# Patient Record
Sex: Male | Born: 2000 | Race: White | Hispanic: No | Marital: Single
Health system: Southern US, Community
[De-identification: ages and names within clinical notes are randomized; demographics above are authoritative.]

---

## 2019-04-15 ENCOUNTER — Encounter (HOSPITAL_COMMUNITY): Payer: Self-pay | Admitting: Emergency Medicine

## 2019-04-15 ENCOUNTER — Emergency Department (HOSPITAL_COMMUNITY)
Admission: EM | Admit: 2019-04-15 | Discharge: 2019-04-15 | Disposition: A | Discharge status: Home / Self Care | Payer: 59 | Attending: Emergency Medicine | Admitting: Emergency Medicine

## 2019-04-15 ENCOUNTER — Other Ambulatory Visit: Payer: Self-pay

## 2019-04-15 ENCOUNTER — Emergency Department (HOSPITAL_COMMUNITY): Payer: 59

## 2019-04-15 DIAGNOSIS — Z23 Encounter for immunization: Secondary | ICD-10-CM | POA: Insufficient documentation

## 2019-04-15 DIAGNOSIS — W231XXA Caught, crushed, jammed, or pinched between stationary objects, initial encounter: Secondary | ICD-10-CM | POA: Insufficient documentation

## 2019-04-15 DIAGNOSIS — Y939 Activity, unspecified: Secondary | ICD-10-CM | POA: Insufficient documentation

## 2019-04-15 DIAGNOSIS — Y999 Unspecified external cause status: Secondary | ICD-10-CM | POA: Diagnosis not present

## 2019-04-15 DIAGNOSIS — Y929 Unspecified place or not applicable: Secondary | ICD-10-CM | POA: Insufficient documentation

## 2019-04-15 DIAGNOSIS — S61311A Laceration without foreign body of left index finger with damage to nail, initial encounter: Secondary | ICD-10-CM | POA: Diagnosis not present

## 2019-04-15 DIAGNOSIS — S62661A Nondisplaced fracture of distal phalanx of left index finger, initial encounter for closed fracture: Secondary | ICD-10-CM | POA: Diagnosis not present

## 2019-04-15 DIAGNOSIS — S61211A Laceration without foreign body of left index finger without damage to nail, initial encounter: Secondary | ICD-10-CM

## 2019-04-15 MED ORDER — CEPHALEXIN 500 MG PO CAPS: 500.0000 mg | ORAL_CAPSULE | Freq: Two times a day (BID) | ORAL | 0 refills | Status: AC

## 2019-04-15 MED ORDER — FENTANYL CITRATE (PF) 100 MCG/2ML IJ SOLN
50.0000 ug | Freq: Once | INTRAMUSCULAR | Status: AC
  Administered 2019-04-15: 50 ug via INTRAMUSCULAR
  Filled 2019-04-15: qty 2

## 2019-04-15 MED ORDER — IBUPROFEN 800 MG PO TABS: 800.0000 mg | ORAL_TABLET | Freq: Three times a day (TID) | ORAL | 0 refills | Status: AC | PRN

## 2019-04-15 MED ORDER — TETANUS-DIPHTH-ACELL PERTUSSIS 5-2.5-18.5 LF-MCG/0.5 IM SUSP
0.5000 mL | Freq: Once | INTRAMUSCULAR | Status: AC
  Administered 2019-04-15: 16:00:00 0.5 mL via INTRAMUSCULAR
  Filled 2019-04-15: qty 0.5

## 2019-04-15 MED ORDER — LIDOCAINE HCL 2 % IJ SOLN
20.0000 mL | Freq: Once | INTRAMUSCULAR | Status: AC
  Administered 2019-04-15: 400 mg
  Filled 2019-04-15: qty 20

## 2019-04-15 NOTE — ED Notes (Signed)
Pt back from X-ray.  

## 2019-04-15 NOTE — ED Notes (Signed)
Ed provider, Marijean Niemann, PA spoke to pt's mother with update on pt's status and answered all questions.

## 2019-04-15 NOTE — ED Notes (Signed)
ED Provider at bedside. 

## 2019-04-15 NOTE — ED Triage Notes (Signed)
Patient reports slamming his L index finger in tailgate of truck as it was coming down. Nailbed intact, hematoma, swelling, laceration to distal phalanx and lateral side of nail, bleeding controlled.

## 2019-04-15 NOTE — ED Provider Notes (Signed)
[facility] EMERGENCY DEPARTMENT Provider Note   CSN: [id number] Arrival date & time: 04/15/19  1438    History   Chief Complaint Chief Complaint  Patient presents with   Finger Injury    HPI Marco Avila is a 18 y.o. male.     The history is provided by the patient and medical records. No language interpreter was used.   Marco Avila is a 18 y.o. male who presents to the Emergency Department complaining of pain to his left thumb and index finger acutely just prior to arrival.  Patient states that his tailgate of his truck was coming down and is flattened, the 2 fingers got trapped inside.  He denies any numbness, but does endorse swelling and pain as well as laceration to the right index finger.  Unsure of tetanus status.  Denies any previous injuries to the left upper extremity.   History reviewed. No pertinent past medical history.  There are no active problems to display for this patient.    Home Medications    Prior to Admission medications   Medication Sig Start Date End Date Taking? Authorizing Provider  cephALEXin (KEFLEX) 500 MG capsule Take 1 capsule (500 mg total) by mouth 2 (two) times daily. 04/15/19   Jyllian Haynie, Chase Picket, PA-C  ibuprofen (ADVIL) 800 MG tablet Take 1 tablet (800 mg total) by mouth every 8 (eight) hours as needed for mild pain or moderate pain. 04/15/19   Kylina Vultaggio, Chase Picket, PA-C    Family History No family history on file.  Social History Social History   Tobacco Use   Smoking status: Not on file  Substance Use Topics   Alcohol use: Not on file   Drug use: Not on file     Allergies   Patient has no known allergies.   Review of Systems Review of Systems  Musculoskeletal: Positive for arthralgias and myalgias.  Skin: Positive for wound.  Neurological: Negative for weakness and numbness.     Physical Exam Updated Vital Signs BP (!) 146/90 (BP Location: Right Arm)    Pulse (!) 115    Temp 97.7 F (36.5  C) (Oral)    Resp (!) 21    Ht 6\' 2"  (1.88 m)    Wt 68 kg    SpO2 100%    BMI 19.26 kg/m   Physical Exam Vitals signs and nursing note reviewed.  Constitutional:      General: He is not in acute distress.    Appearance: He is well-developed.  HENT:     Head: Normocephalic and atraumatic.  Neck:     Musculoskeletal: Neck supple.  Cardiovascular:     Rate and Rhythm: Normal rate and regular rhythm.     Heart sounds: Normal heart sounds. No murmur.  Pulmonary:     Effort: Pulmonary effort is normal. No respiratory distress.     Breath sounds: Normal breath sounds. No wheezing or rales.  Musculoskeletal: Normal range of motion.     Comments: See images below. Tender to distal index and thumb. Good cap refill and full ROM. 2+ radial pulse. Sensation intact.   Skin:    General: Skin is warm and dry.  Neurological:     Mental Status: He is alert.        ED Treatments / Results  Labs (all labs ordered are listed, but only abnormal results are displayed) Labs Reviewed - No data to display  EKG None  Radiology Dg Hand Complete Left  Result Date: 04/15/2019  CLINICAL DATA:  Crush injury to index finger. EXAM: LEFT HAND - COMPLETE 3+ VIEW COMPARISON:  None. FINDINGS: Nondisplaced fractures involving the ulnar margins of the first and second distal phalanx tufts. Dorsal soft tissue irregularity involving the index finger nail bed. Joint spaces are preserved. Bone mineralization is normal. IMPRESSION: 1. Nondisplaced fractures of the first and second distal phalanx tufts. Electronically Signed   By: Obie Dredge M.D.   On: 04/15/2019 15:29    Procedures .Marland KitchenLaceration Repair Date/Time: 04/15/2019 4:39 PM Performed by: Agata Lucente, Chase Picket, PA-C Authorized by: Leahanna Buser, Chase Picket, PA-C   Consent:    Consent obtained:  Verbal   Consent given by:  Patient   Risks discussed:  Pain, infection, poor cosmetic result, poor wound healing and need for additional repair Anesthesia (see  MAR for exact dosages):    Anesthesia method:  Nerve block   Block location:  Left index finger.   Block needle gauge:  27 G   Block anesthetic:  Lidocaine 2% w/o epi   Block technique:  Digital block   Block injection procedure:  Anatomic landmarks identified, introduced needle, negative aspiration for blood and incremental injection   Block outcome:  Anesthesia achieved Laceration details:    Location:  Finger   Finger location:  L index finger   Length (cm):  2.5 Repair type:    Repair type:  Simple Pre-procedure details:    Preparation:  Patient was prepped and draped in usual sterile fashion and imaging obtained to evaluate for foreign bodies Exploration:    Hemostasis achieved with:  Direct pressure   Wound exploration: wound explored through full range of motion and entire depth of wound probed and visualized   Treatment:    Area cleansed with:  Saline   Amount of cleaning:  Standard   Irrigation solution:  Sterile saline Skin repair:    Repair method:  Sutures   Suture size:  5-0   Wound skin closure material used: Vicryl Rapide.   Suture technique:  Simple interrupted   Number of sutures:  6 Approximation:    Approximation:  Close Post-procedure details:    Dressing:  Antibiotic ointment, splint for protection and tube gauze   Patient tolerance of procedure:  Tolerated well, no immediate complications .Splint Application Date/Time: 04/15/2019 4:40 PM Performed by: Anastassia Noack, Chase Picket, PA-C Authorized by: Dimitry Holsworth, Chase Picket, PA-C   Consent:    Consent obtained:  Verbal   Consent given by:  Patient Pre-procedure details:    Sensation:  Normal Procedure details:    Laterality:  Left   Location:  Finger   Finger:  L index finger   Splint type:  Finger   Supplies:  Aluminum splint Post-procedure details:    Pain:  Unchanged   Sensation:  Normal   Patient tolerance of procedure:  Tolerated well, no immediate complications   (including critical care  time)  Medications Ordered in ED Medications  fentaNYL (SUBLIMAZE) injection 50 mcg (50 mcg Intramuscular Given 04/15/19 1501)  lidocaine (XYLOCAINE) 2 % (with pres) injection 400 mg (400 mg Other Given 04/15/19 1624)  Tdap (BOOSTRIX) injection 0.5 mL (0.5 mLs Intramuscular Given 04/15/19 1607)     Initial Impression / Assessment and Plan / ED Course  I have reviewed the triage vital signs and the nursing notes.  Pertinent labs & imaging results that were available during my care of the patient were reviewed by me and considered in my medical decision making (see chart for details).  Jye Fariss is a 18 y.o. male who presents to ED for finger pain after shutting thumb and index finger into truck tailgate just prior to arrival.  Sensation, range of motion intact.  Good cap refill.  2+ radial pulse.  X-ray shows nondisplaced fractures of the first and second distal phalanx tufts.  Discussed case with on-call hand surgery, Dr. Janee Morn.  Will splint and have him follow-up in the office.  Orthopedic office to call him on Monday to arrange for follow-up, probably in a week.  There is no damage to the nail.  Laceration just beneath the nail was repaired as dictated above.  Counseled on home wound care, follow-up plan and return precautions.  Discussed this with his mother on speaker phone as well as that she is aware.  All questions answered.  Final Clinical Impressions(s) / ED Diagnoses   Final diagnoses:  Laceration of left index finger without foreign body without damage to nail, initial encounter  Closed nondisplaced fracture of distal phalanx of left index finger, initial encounter    ED Discharge Orders         Ordered    ibuprofen (ADVIL) 800 MG tablet  Every 8 hours PRN     04/15/19 1644    cephALEXin (KEFLEX) 500 MG capsule  2 times daily     04/15/19 1644           Torina Ey, Chase Picket, PA-C 04/15/19 1646    Tilden Fossa, MD 04/16/19 2314

## 2019-04-15 NOTE — ED Notes (Signed)
Patient transported to X-ray 

## 2019-04-15 NOTE — Discharge Instructions (Signed)
It was my pleasure taking care of you today!   Keep hand clean. Wash several times a day with soap and water.  The antibiotic I gave you will help prevent infection as well. Take twice daily as directed.  Ice will help with pain / swelling.  Ibuprofen for pain as well. You can also take Tylenol if needed for further pain relief.   Dr. Carollee Massed (hand surgeon) office should call you on Monday to schedule a follow up appointment. If you do not hear from them by Monday afternoon, give their office a call to schedule an appointment.   Return to ER for new or worsening symptoms, any additional concerns.

## 2019-04-15 NOTE — ED Notes (Signed)
Patient verbalizes understanding of discharge instructions. Opportunity for questioning and answers were provided. Armband removed by staff, pt discharged from ED.  

## 2020-02-24 IMAGING — DX LEFT HAND - COMPLETE 3+ VIEW
3 series · 3 of 3 positions shown · non-contrast
Comparison: None.

CLINICAL DATA: Crush injury to index finger.

EXAM:
LEFT HAND - COMPLETE 3+ VIEW

[hand pa]
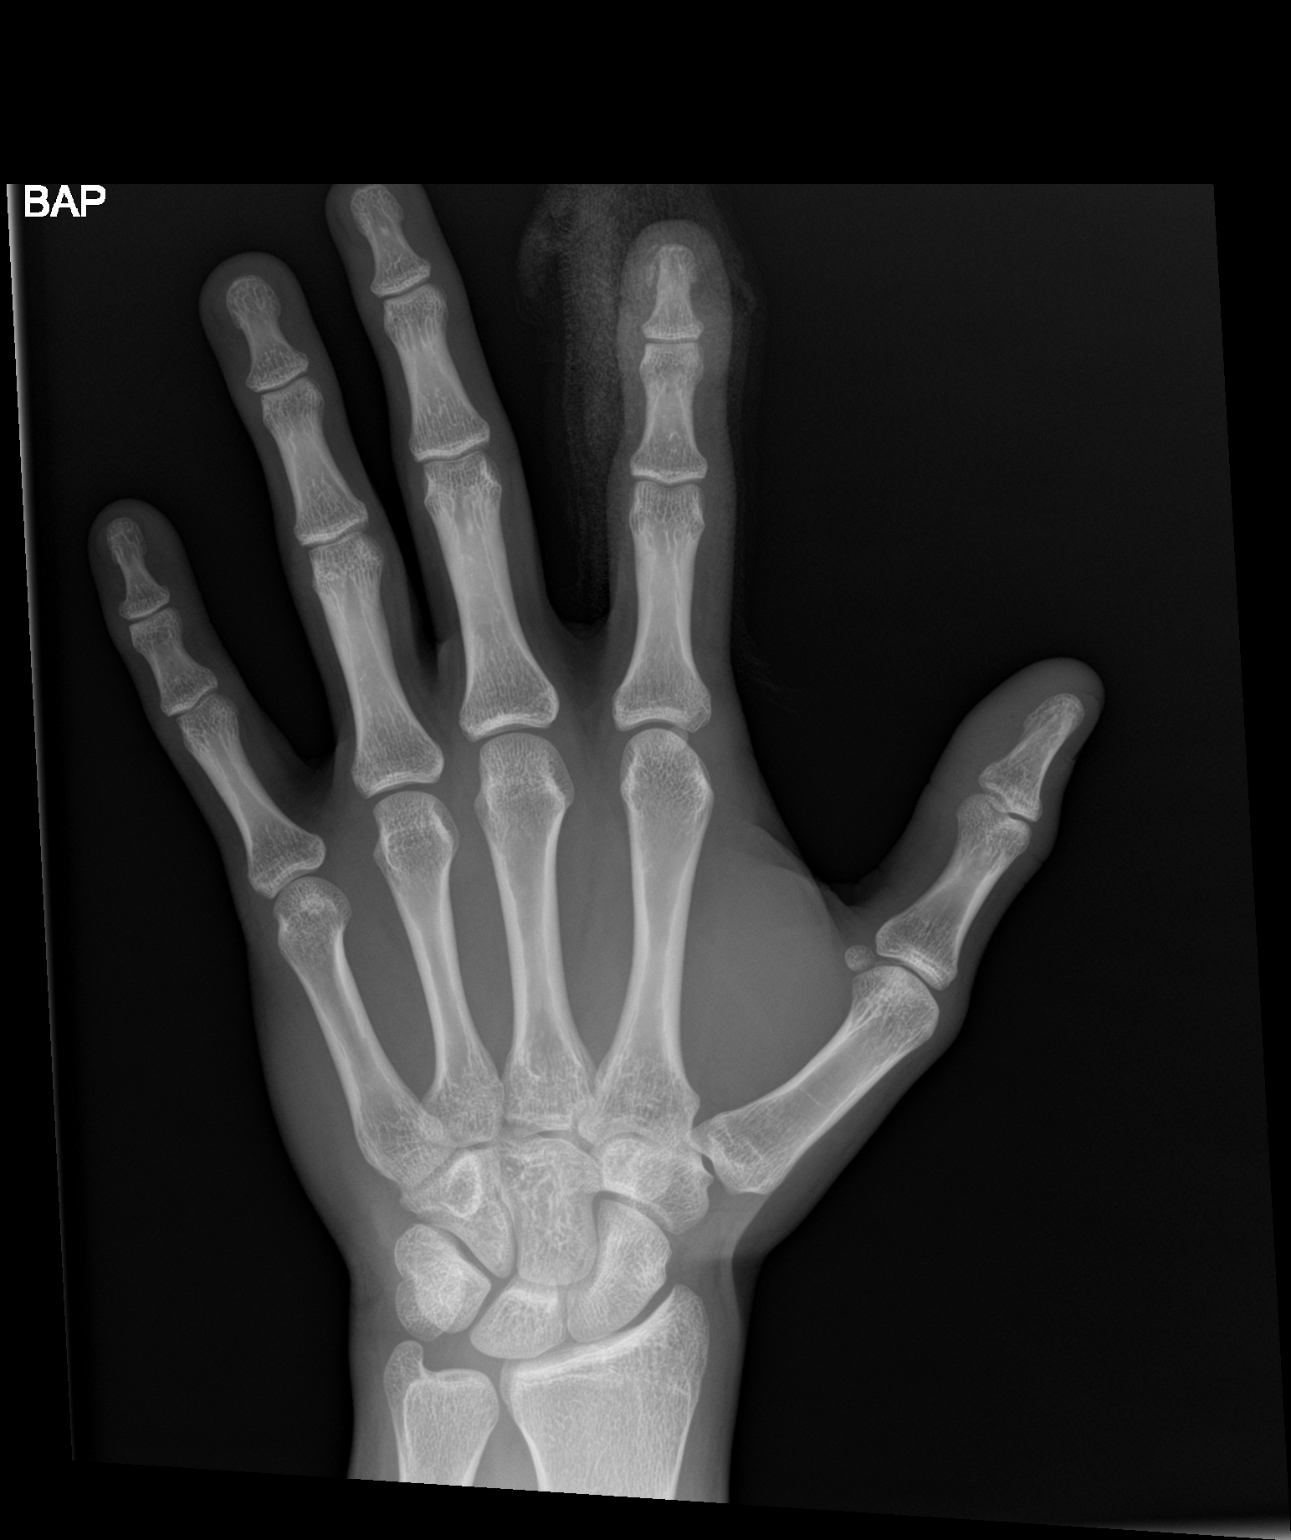

[hand obl]
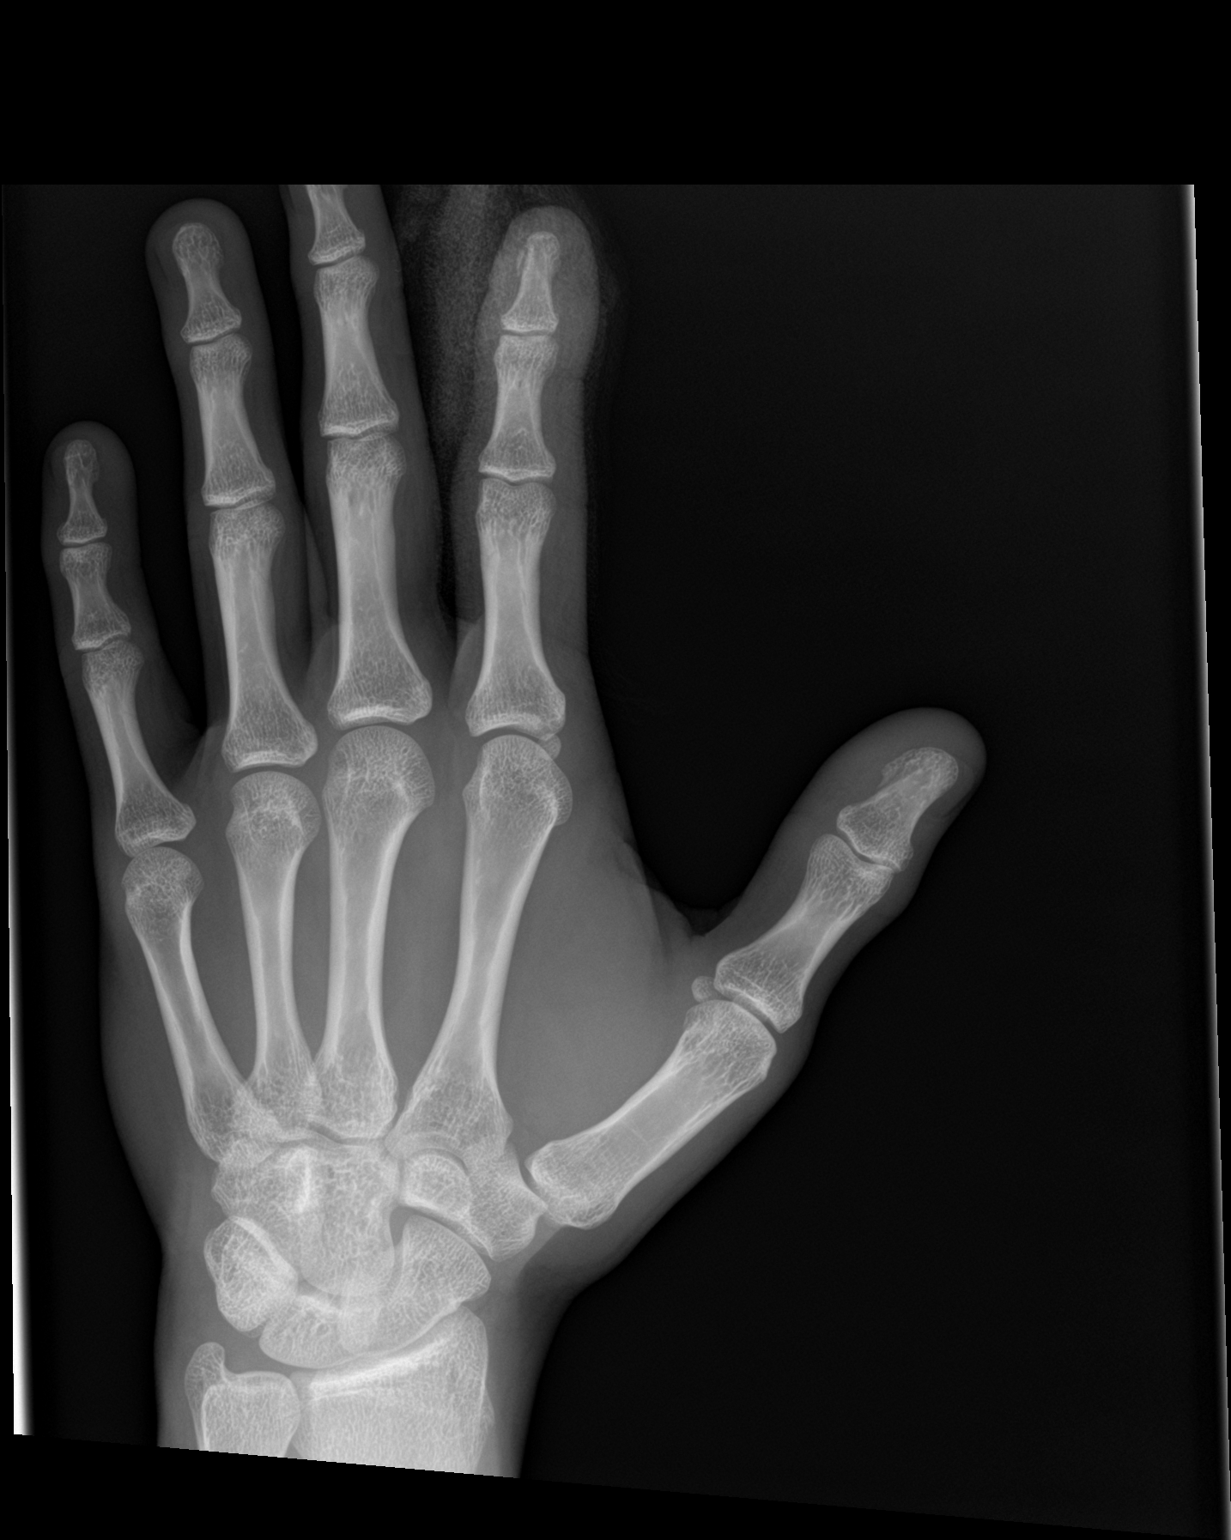

[hand lat]
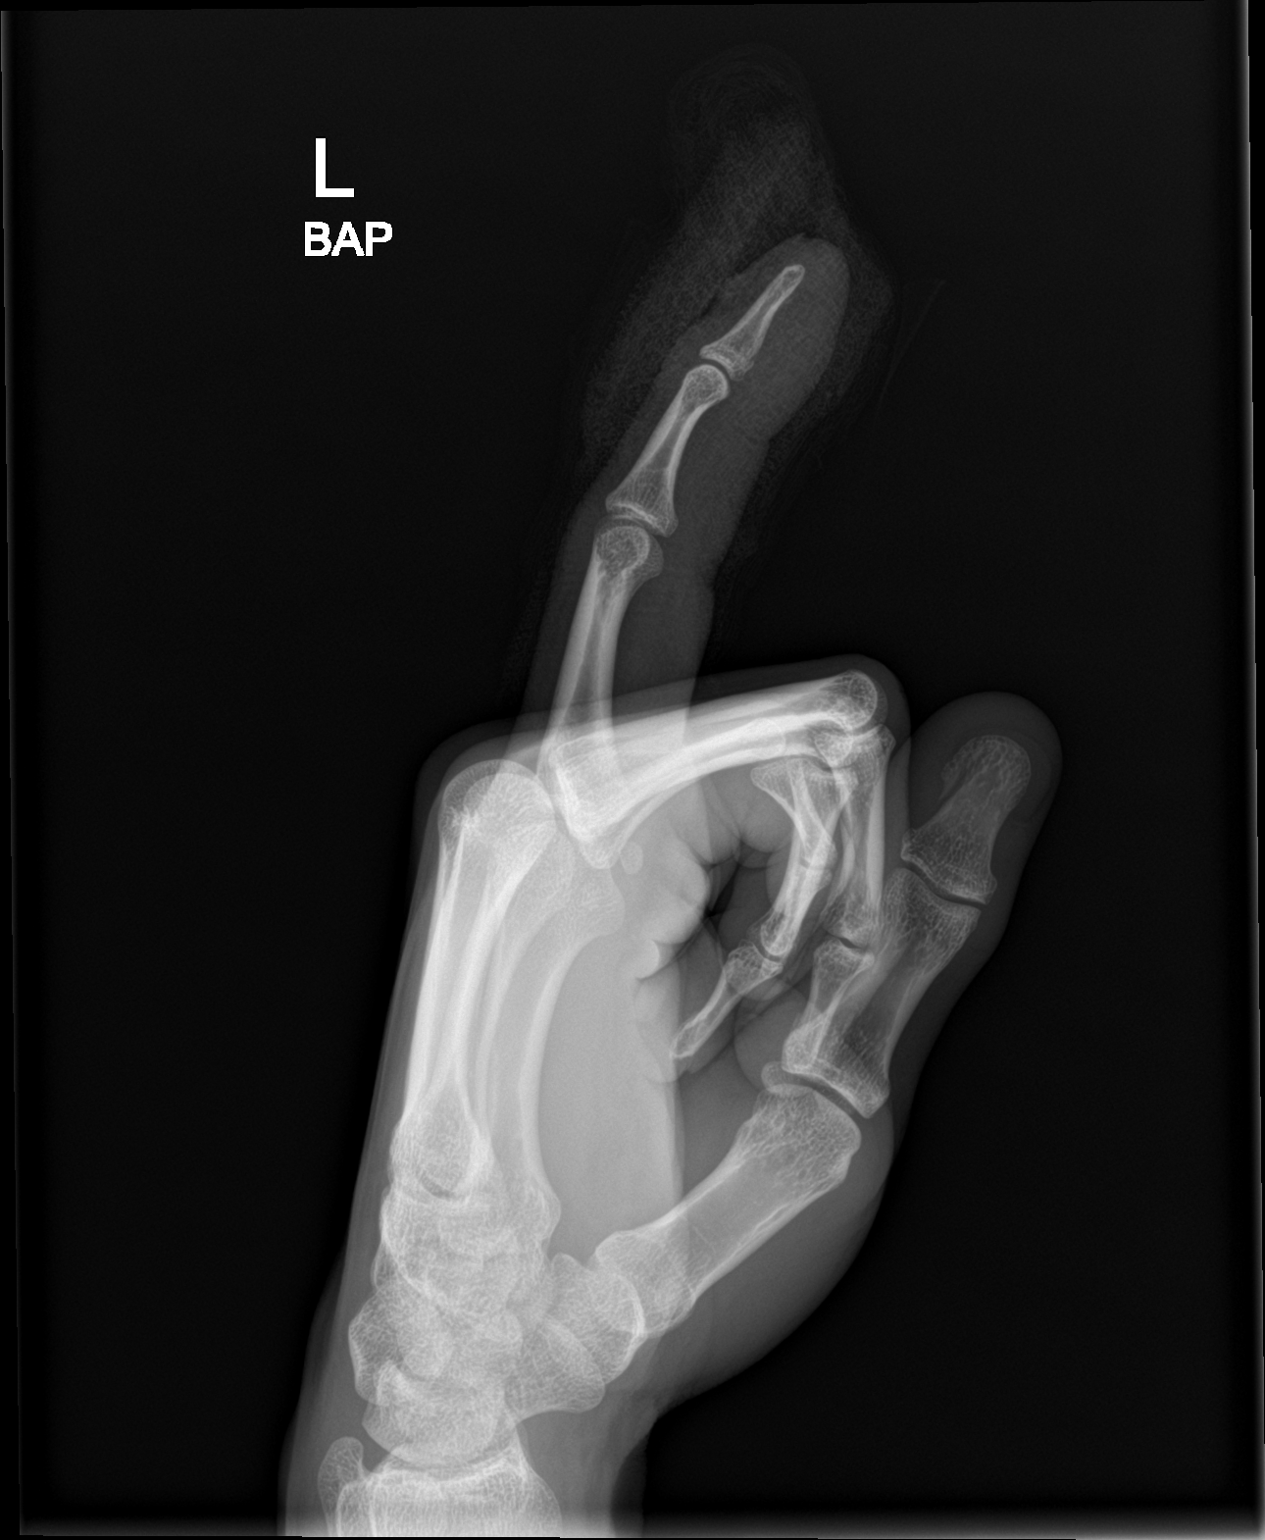

[3 of 3 positions shown; findings below may reference images not displayed]

FINDINGS: Nondisplaced fractures involving the ulnar margins of the first and
second distal phalanx [REDACTED]. Dorsal soft tissue irregularity
involving the index finger nail bed. Joint spaces are preserved.
Bone mineralization is normal.
IMPRESSION: 1. Nondisplaced fractures of the first and second distal phalanx
[REDACTED].
